# Patient Record
Sex: Female | Born: 2000 | Race: Black or African American | Hispanic: No | Marital: Single
Health system: Southern US, Community
[De-identification: ages and names within clinical notes are randomized; demographics above are authoritative.]

---
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
## 2020-03-25 ENCOUNTER — Other Ambulatory Visit: Payer: Self-pay

## 2020-03-25 ENCOUNTER — Ambulatory Visit: Payer: Self-pay | Attending: Internal Medicine

## 2020-03-25 DIAGNOSIS — Z23 Encounter for immunization: Secondary | ICD-10-CM

## 2020-03-25 NOTE — Progress Notes (Signed)
   [facility]  Name:  Javaeh Muscatello    MRN: [id number] DOB: 03-Jan-2001  03/25/2020  Ms. Culbreth was observed post Covid-19 immunization for 15 minutes without incident. She was provided with Vaccine Information Sheet and instruction to access the V-Safe system.   Ms. Verret was instructed to call 911 with any severe reactions post vaccine: Marland Kitchen Difficulty breathing  . Swelling of face and throat  . A fast heartbeat  . A bad rash all over body  . Dizziness and weakness   Immunizations Administered    Name Date Dose VIS Date Route   PFIZER Comrnaty(Gray TOP) Covid-19 Vaccine 03/25/2020  2:25 PM 0.3 mL 01/24/2020 Intramuscular   Manufacturer: [employer], [employer]   Lot: [id number]   NDC: [phone]

## 2020-04-18 ENCOUNTER — Ambulatory Visit: Payer: Self-pay | Attending: Family
